# Patient Record
Sex: Female | Born: 2000 | Race: Black or African American | Hispanic: No | Marital: Single | Smoking: Current some day smoker
Health system: Southern US, Community
[De-identification: ages and names within clinical notes are randomized; demographics above are authoritative.]

## PROBLEM LIST (undated history)

## (undated) DIAGNOSIS — F329 Major depressive disorder, single episode, unspecified: Secondary | ICD-10-CM

## (undated) DIAGNOSIS — F32A Depression, unspecified: Secondary | ICD-10-CM

## (undated) DIAGNOSIS — T7840XA Allergy, unspecified, initial encounter: Secondary | ICD-10-CM

## (undated) HISTORY — DX: Major depressive disorder, single episode, unspecified: F32.9

## (undated) HISTORY — DX: Depression, unspecified: F32.A

## (undated) HISTORY — PX: MANDIBLE SURGERY: SHX707

---
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
## 2019-01-18 ENCOUNTER — Ambulatory Visit (HOSPITAL_COMMUNITY)
Admission: EM | Admit: 2019-01-18 | Discharge: 2019-01-18 | Disposition: A | Discharge status: Home / Self Care | Payer: BC Managed Care – PPO | Attending: Family Medicine | Admitting: Family Medicine

## 2019-01-18 ENCOUNTER — Other Ambulatory Visit: Payer: Self-pay

## 2019-01-18 ENCOUNTER — Encounter (HOSPITAL_COMMUNITY): Payer: Self-pay

## 2019-01-18 DIAGNOSIS — Z818 Family history of other mental and behavioral disorders: Secondary | ICD-10-CM | POA: Insufficient documentation

## 2019-01-18 DIAGNOSIS — R05 Cough: Secondary | ICD-10-CM | POA: Insufficient documentation

## 2019-01-18 DIAGNOSIS — Z9109 Other allergy status, other than to drugs and biological substances: Secondary | ICD-10-CM | POA: Diagnosis not present

## 2019-01-18 DIAGNOSIS — Z20828 Contact with and (suspected) exposure to other viral communicable diseases: Secondary | ICD-10-CM | POA: Insufficient documentation

## 2019-01-18 DIAGNOSIS — Z91013 Allergy to seafood: Secondary | ICD-10-CM | POA: Insufficient documentation

## 2019-01-18 DIAGNOSIS — Z915 Personal history of self-harm: Secondary | ICD-10-CM | POA: Diagnosis not present

## 2019-01-18 DIAGNOSIS — Z91018 Allergy to other foods: Secondary | ICD-10-CM | POA: Diagnosis not present

## 2019-01-18 DIAGNOSIS — J029 Acute pharyngitis, unspecified: Secondary | ICD-10-CM | POA: Insufficient documentation

## 2019-01-18 DIAGNOSIS — R059 Cough, unspecified: Secondary | ICD-10-CM

## 2019-01-18 LAB — POCT RAPID STREP A: Streptococcus, Group A Screen (Direct): NEGATIVE

## 2019-01-18 NOTE — Discharge Instructions (Signed)
Your COVID test is pending.  You should self quarantine until your test result is back and is negative.   ° °Go to the emergency department if you develop high fever, shortness of breath, severe diarrhea, or other concerning symptoms.   ° °

## 2019-01-18 NOTE — ED Provider Notes (Signed)
[place]    CSN: [id number] Arrival date & time: 01/18/19  1237      History   Chief Complaint Chief Complaint  Patient presents with   Sore Throat   Cough    HPI Yesenia Hatfield is a 18 y.o. female.   Patient presents with 4-day history of nonproductive cough.  She denies fever, chills, sore throat, shortness of breath, abdominal pain, vomiting, diarrhea, or other symptoms.  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  She requested COVID test today  The history is provided by the patient.    Past Medical History:  Diagnosis Date   Allergy    Depression    couple years    Patient Active Problem List   Diagnosis Date Noted   MDD (major depressive disorder), recurrent episode ([place]) 05/03/2015   Suicide attempt by multiple drug overdose ([place]) 05/03/2015    History reviewed. No pertinent surgical history.  OB History   No obstetric history on file.      Home Medications    Prior to Admission medications   Medication Sig Start Date End Date Taking? Authorizing Provider  ibuprofen (ADVIL,MOTRIN) 600 MG tablet Take 1 tab PO Q6H x 1-2 days then Q6H prn pain 04/26/16   Kristen Cardinal, NP    Family History Family History  Problem Relation Age of Onset   Depression Mother    Bipolar disorder Brother    Schizophrenia Brother    Drug abuse Maternal Grandfather    Drug abuse Maternal Grandmother    Depression Maternal Grandmother    Anxiety disorder Maternal Grandmother    Drug abuse Paternal Grandfather    Drug abuse Paternal Grandmother    Depression Paternal Grandmother    Suicidality Paternal Grandmother     Social History Social History   Tobacco Use   Smoking status: Never Smoker   Smokeless tobacco: Never Used  Substance Use Topics   Alcohol use: No   Drug use: Yes    Frequency: 3.0 times per week    Types: Marijuana    Comment: 1 time a week     Allergies     Citrus, Other, and Shellfish allergy   Review of Systems Review of Systems  Constitutional: Negative for chills and fever.  HENT: Negative for congestion, ear pain, rhinorrhea and sore throat.   Eyes: Negative for pain and visual disturbance.  Respiratory: Positive for cough. Negative for shortness of breath.   Cardiovascular: Negative for chest pain and palpitations.  Gastrointestinal: Negative for abdominal pain, diarrhea and vomiting.  Genitourinary: Negative for dysuria and hematuria.  Musculoskeletal: Negative for arthralgias and back pain.  Skin: Negative for color change and rash.  Neurological: Negative for seizures and syncope.  All other systems reviewed and are negative.    Physical Exam Triage Vital Signs ED Triage Vitals  Enc Vitals Group     BP      Pulse      Resp      Temp      Temp src      SpO2      Weight      Height      Head Circumference      Peak Flow      Pain Score      Pain Loc      Pain Edu?      Excl. in [place]?    No data found.  Updated Vital Signs  BP 128/73 (BP Location: Left Arm)    Pulse 93    Temp 98 F (36.7 C) (Tympanic)    Resp 17    LMP 12/14/2018    SpO2 100%   Visual Acuity Right Eye Distance:   Left Eye Distance:   Bilateral Distance:    Right Eye Near:   Left Eye Near:    Bilateral Near:     Physical Exam Vitals signs and nursing note reviewed.  Constitutional:      General: She is not in acute distress.    Appearance: She is well-developed.  HENT:     Head: Normocephalic and atraumatic.     Right Ear: Tympanic membrane normal.     Left Ear: Tympanic membrane normal.     Nose: Nose normal.     Mouth/Throat:     Mouth: Mucous membranes are moist.     Pharynx: Oropharynx is clear.  Eyes:     Conjunctiva/sclera: Conjunctivae normal.  Neck:     Musculoskeletal: Neck supple.  Cardiovascular:     Rate and Rhythm: Normal rate and regular rhythm.     Heart sounds: No murmur.  Pulmonary:     Effort: Pulmonary  effort is normal. No respiratory distress.     Breath sounds: Normal breath sounds.  Abdominal:     General: Bowel sounds are normal.     Palpations: Abdomen is soft.     Tenderness: There is no abdominal tenderness. There is no guarding or rebound.  Skin:    General: Skin is warm and dry.     Findings: No rash.  Neurological:     General: No focal deficit present.     Mental Status: She is alert and oriented to person, place, and time.      UC Treatments / Results  Labs (all labs ordered are listed, but only abnormal results are displayed) Labs Reviewed  NOVEL CORONAVIRUS, NAA (HOSP ORDER, SEND-OUT TO REF LAB; TAT 18-24 HRS)    EKG   Radiology No results found.  Procedures Procedures (including critical care time)  Medications Ordered in UC Medications - No data to display  Initial Impression / Assessment and Plan / UC Course  I have reviewed the triage vital signs and the nursing notes.  Pertinent labs & imaging results that were available during my care of the patient were reviewed by me and considered in my medical decision making (see chart for details).    Cough.  Instructed patient to take Tylenol as needed for discomfort or fever.  COVID test performed here.  Instructed patient to self quarantine until the test result is back.  Instructed patient to go to the emergency department if develops high fever, shortness of breath, severe diarrhea, or other concerning symptoms.  Patient agrees with plan of care.   Final Clinical Impressions(s) / UC Diagnoses   Final diagnoses:  Cough     Discharge Instructions     Your COVID test is pending.  You should self quarantine until your test result is back and is negative.    Go to the emergency department if you develop high fever, shortness of breath, severe diarrhea, or other concerning symptoms.       ED Prescriptions    None     PDMP not reviewed this encounter.   Mickie Bail, NP 01/18/19 1355

## 2019-01-18 NOTE — ED Triage Notes (Signed)
Pt presents with non productive cough and sore throat for past few days; pt states she had a covid exposure at work.

## 2019-01-19 LAB — NOVEL CORONAVIRUS, NAA (HOSP ORDER, SEND-OUT TO REF LAB; TAT 18-24 HRS): SARS-CoV-2, NAA: NOT DETECTED

## 2019-01-20 LAB — CULTURE, GROUP A STREP (THRC)

## 2019-01-24 ENCOUNTER — Telehealth (HOSPITAL_COMMUNITY): Payer: Self-pay | Admitting: Emergency Medicine

## 2019-01-24 NOTE — Telephone Encounter (Signed)
Pt called requesting test results. Results reported as negative.  

## 2019-06-13 ENCOUNTER — Other Ambulatory Visit: Payer: Self-pay

## 2019-06-13 ENCOUNTER — Encounter (HOSPITAL_COMMUNITY): Payer: Self-pay

## 2019-06-13 ENCOUNTER — Ambulatory Visit (HOSPITAL_COMMUNITY)
Admission: EM | Admit: 2019-06-13 | Discharge: 2019-06-13 | Disposition: A | Discharge status: Home / Self Care | Payer: BC Managed Care – PPO

## 2019-06-13 DIAGNOSIS — H66002 Acute suppurative otitis media without spontaneous rupture of ear drum, left ear: Secondary | ICD-10-CM | POA: Diagnosis not present

## 2019-06-13 MED ORDER — FLUTICASONE PROPIONATE 50 MCG/ACT NA SUSP: 1.0000 | Freq: Every day | NASAL | 0 refills | Status: DC

## 2019-06-13 MED ORDER — AMOXICILLIN-POT CLAVULANATE 875-125 MG PO TABS: 1.0000 | ORAL_TABLET | Freq: Two times a day (BID) | ORAL | 0 refills | Status: AC

## 2019-06-13 NOTE — Discharge Instructions (Signed)
Daily flonase.  Complete course of antibiotics.  Push fluids to ensure adequate hydration and keep secretions thin.  Continue with mucinex.  If symptoms worsen or do not improve in the next week to return to be seen or to follow up with PCP.

## 2019-06-13 NOTE — ED Triage Notes (Signed)
Pt is here with left ear pain that started 3 days ago, pt has taken tea tree oil & ear drops to relieve discomfort.

## 2019-06-13 NOTE — ED Provider Notes (Signed)
[place]    CSN: [id number] Arrival date & time: 06/13/19  0809      History   Chief Complaint Chief Complaint  Patient presents with  . Otalgia    HPI Yesenia Hatfield is a 19 y.o. female.   Yesenia Hatfield presents with complaints of left ear pain which started approximately 4 days ago. Started out with sinus pressure and drainage 1 week ago. Still with some nasal drainage. No cough. No headache. No fevers. No body aches. Now with left ear pain and decreased hearing. Pain 4/10. Has been using mucinex, over the counter ear drops and placing tea tree oil in the ear which hasn't helped. No drainage from the ear. Denies any previous similar.    ROS per HPI, negative if not otherwise mentioned.      Past Medical History:  Diagnosis Date  . Allergy   . Depression    couple years    Patient Active Problem List   Diagnosis Date Noted  . MDD (major depressive disorder), recurrent episode ([place]) 05/03/2015  . Suicide attempt by multiple drug overdose ([place]) 05/03/2015    History reviewed. No pertinent surgical history.  OB History   No obstetric history on file.      Home Medications    Prior to Admission medications   Medication Sig Start Date End Date Taking? Authorizing Brylei Pedley  amoxicillin-clavulanate (AUGMENTIN) 875-125 MG tablet Take 1 tablet by mouth every 12 (twelve) hours for 5 days. 06/13/19 06/18/19  Zigmund Gottron, NP  Biotin 1 MG CAPS Take by mouth.    Yoshi Vicencio, Historical, MD  Cholecalciferol 25 MCG (1000 UT) tablet Take by mouth.    Rodrickus Min, Historical, MD  cyclobenzaprine (FLEXERIL) 5 MG tablet Take by mouth.    Burrel Legrand, Historical, MD  fluticasone (FLONASE) 50 MCG/ACT nasal spray Place 1 spray into both nostrils daily. 06/13/19   Zigmund Gottron, NP  ibuprofen (ADVIL,MOTRIN) 600 MG tablet Take 1 tab PO Q6H x 1-2 days then Q6H prn pain 04/26/16   Kristen Cardinal, NP  Multiple Vitamin (MULTI-VITAMIN) tablet Take by mouth.    Xian Alves,  Historical, MD    Family History Family History  Problem Relation Age of Onset  . Depression Mother   . Hypertension Mother   . Bipolar disorder Brother   . Schizophrenia Brother   . Drug abuse Maternal Grandfather   . Drug abuse Maternal Grandmother   . Depression Maternal Grandmother   . Anxiety disorder Maternal Grandmother   . Drug abuse Paternal Grandfather   . Drug abuse Paternal Grandmother   . Depression Paternal Grandmother   . Suicidality Paternal Grandmother   . Diabetes Father     Social History Social History   Tobacco Use  . Smoking status: Never Smoker  . Smokeless tobacco: Never Used  Substance Use Topics  . Alcohol use: No  . Drug use: Yes    Frequency: 3.0 times per week    Types: Marijuana    Comment: 1 time a week     Allergies   Citrus, Other, and Shellfish allergy   Review of Systems Review of Systems   Physical Exam Triage Vital Signs ED Triage Vitals  Enc Vitals Group     BP 06/13/19 0825 121/83     Pulse Rate 06/13/19 0825 100     Resp 06/13/19 0825 19     Temp 06/13/19 0825 (!) 97.5 F (36.4 C)     Temp Source 06/13/19 0825 Oral     SpO2  06/13/19 0825 98 %     Weight 06/13/19 0823 295 lb 6.4 oz (134 kg)     Height --      Head Circumference --      Peak Flow --      Pain Score 06/13/19 0822 4     Pain Loc --      Pain Edu? --      Excl. in GC? --    No data found.  Updated Vital Signs BP 121/83 (BP Location: Right Arm)   Pulse 100   Temp (!) 97.5 F (36.4 C) (Oral)   Resp 19   Wt 295 lb 6.4 oz (134 kg)   LMP 05/17/2019   SpO2 98%   Visual Acuity Right Eye Distance:   Left Eye Distance:   Bilateral Distance:    Right Eye Near:   Left Eye Near:    Bilateral Near:     Physical Exam Constitutional:      General: She is not in acute distress.    Appearance: She is well-developed.  HENT:     Right Ear: Hearing and ear canal normal. A middle ear effusion is present.     Left Ear: A middle ear effusion is  present. Tympanic membrane is erythematous and bulging.  Cardiovascular:     Rate and Rhythm: Normal rate.  Pulmonary:     Effort: Pulmonary effort is normal.  Skin:    General: Skin is warm and dry.  Neurological:     Mental Status: She is alert and oriented to person, place, and time.      UC Treatments / Results  Labs (all labs ordered are listed, but only abnormal results are displayed) Labs Reviewed - No data to display  EKG   Radiology No results found.  Procedures Procedures (including critical care time)  Medications Ordered in UC Medications - No data to display  Initial Impression / Assessment and Plan / UC Course  I have reviewed the triage vital signs and the nursing notes.  Pertinent labs & imaging results that were available during my care of the patient were reviewed by me and considered in my medical decision making (see chart for details).     Left AOM. augmentin provided. Return precautions provided. Patient verbalized understanding and agreeable to plan.   Final Clinical Impressions(s) / UC Diagnoses   Final diagnoses:  Non-recurrent acute suppurative otitis media of left ear without spontaneous rupture of tympanic membrane     Discharge Instructions     Daily flonase.  Complete course of antibiotics.  Push fluids to ensure adequate hydration and keep secretions thin.  Continue with mucinex.  If symptoms worsen or do not improve in the next week to return to be seen or to follow up with PCP.      ED Prescriptions    Medication Sig Dispense Auth. Francille Wittmann   amoxicillin-clavulanate (AUGMENTIN) 875-125 MG tablet Take 1 tablet by mouth every 12 (twelve) hours for 5 days. 10 tablet Linus Mako B, NP   fluticasone (FLONASE) 50 MCG/ACT nasal spray Place 1 spray into both nostrils daily. 16 g Georgetta Haber, NP     PDMP not reviewed this encounter.   Georgetta Haber, NP 06/13/19 [phone]

## 2019-08-13 ENCOUNTER — Encounter (HOSPITAL_COMMUNITY): Payer: Self-pay | Admitting: Emergency Medicine

## 2019-08-13 ENCOUNTER — Emergency Department (HOSPITAL_COMMUNITY)
Admission: EM | Admit: 2019-08-13 | Discharge: 2019-08-13 | Disposition: A | Discharge status: Home / Self Care | Payer: BC Managed Care – PPO | Attending: Emergency Medicine | Admitting: Emergency Medicine

## 2019-08-13 DIAGNOSIS — K529 Noninfective gastroenteritis and colitis, unspecified: Secondary | ICD-10-CM | POA: Insufficient documentation

## 2019-08-13 DIAGNOSIS — R112 Nausea with vomiting, unspecified: Secondary | ICD-10-CM | POA: Insufficient documentation

## 2019-08-13 DIAGNOSIS — R197 Diarrhea, unspecified: Secondary | ICD-10-CM

## 2019-08-13 LAB — COMPREHENSIVE METABOLIC PANEL
ALT: 15 U/L (ref 0–44)
AST: 21 U/L (ref 15–41)
Albumin: 3.8 g/dL (ref 3.5–5.0)
Alkaline Phosphatase: 84 U/L (ref 38–126)
Anion gap: 12 (ref 5–15)
BUN: 9 mg/dL (ref 6–20)
CO2: 22 mmol/L (ref 22–32)
Calcium: 9.3 mg/dL (ref 8.9–10.3)
Chloride: 103 mmol/L (ref 98–111)
Creatinine, Ser: 0.91 mg/dL (ref 0.44–1.00)
GFR calc Af Amer: >60 mL/min (ref >60)
GFR calc non Af Amer: >60 mL/min (ref >60)
Glucose, Bld: 134 mg/dL — ABNORMAL HIGH (ref 70–99)
Potassium: 4 mmol/L (ref 3.5–5.1)
Sodium: 137 mmol/L (ref 135–145)
Total Bilirubin: 0.6 mg/dL (ref 0.3–1.2)
Total Protein: 8.5 g/dL — ABNORMAL HIGH (ref 6.5–8.1)

## 2019-08-13 LAB — RAPID URINE DRUG SCREEN, HOSP PERFORMED
Amphetamines: NOT DETECTED
Barbiturates: NOT DETECTED
Benzodiazepines: NOT DETECTED
Cocaine: NOT DETECTED
Opiates: NOT DETECTED
Tetrahydrocannabinol: POSITIVE — AB

## 2019-08-13 LAB — CBC
HCT: 43 % (ref 36.0–46.0)
Hemoglobin: 13.6 g/dL (ref 12.0–15.0)
MCH: 26.4 pg (ref 26.0–34.0)
MCHC: 31.6 g/dL (ref 30.0–36.0)
MCV: 83.5 fL (ref 80.0–100.0)
Platelets: 388 10*3/uL (ref 150–400)
RBC: 5.15 MIL/uL — ABNORMAL HIGH (ref 3.87–5.11)
RDW: 13.2 % (ref 11.5–15.5)
WBC: 16.3 10*3/uL — ABNORMAL HIGH (ref 4.0–10.5)
nRBC: 0 % (ref 0.0–0.2)

## 2019-08-13 LAB — URINALYSIS, ROUTINE W REFLEX MICROSCOPIC
Bilirubin Urine: NEGATIVE
Glucose, UA: NEGATIVE mg/dL
Hgb urine dipstick: NEGATIVE
Ketones, ur: NEGATIVE mg/dL
Leukocytes,Ua: NEGATIVE
Nitrite: NEGATIVE
Protein, ur: NEGATIVE mg/dL
Specific Gravity, Urine: 1.023 (ref 1.005–1.030)
pH: 5 (ref 5.0–8.0)

## 2019-08-13 LAB — LIPASE, BLOOD: Lipase: 24 U/L (ref 11–51)

## 2019-08-13 LAB — I-STAT BETA HCG BLOOD, ED (MC, WL, AP ONLY): I-stat hCG, quantitative: <5 m[IU]/mL (ref <5)

## 2019-08-13 MED ORDER — ONDANSETRON HCL 4 MG/2ML IJ SOLN
4.0000 mg | Freq: Once | INTRAMUSCULAR | Status: AC
  Administered 2019-08-13: 4 mg via INTRAVENOUS
  Filled 2019-08-13: qty 2

## 2019-08-13 MED ORDER — CAPSAICIN 0.025 % EX CREA
TOPICAL_CREAM | Freq: Once | CUTANEOUS | Status: AC
  Filled 2019-08-13: qty 60

## 2019-08-13 MED ORDER — SODIUM CHLORIDE 0.9 % IV BOLUS
1000.0000 mL | Freq: Once | INTRAVENOUS | Status: AC
  Administered 2019-08-13: 1000 mL via INTRAVENOUS

## 2019-08-13 MED ORDER — ONDANSETRON 4 MG PO TBDP: 4.0000 mg | ORAL_TABLET | Freq: Three times a day (TID) | ORAL | 0 refills | Status: AC | PRN

## 2019-08-13 MED ORDER — SODIUM CHLORIDE 0.9% FLUSH: 3.0000 mL | Freq: Once | INTRAVENOUS | Status: DC

## 2019-08-13 NOTE — ED Triage Notes (Signed)
Pt reports n/v/d since last night, concerned for possible food poisoning, denies fevers. Took pepto but unable to keep it down.

## 2019-08-13 NOTE — ED Notes (Signed)
Ambulated to the bathroom without incident 

## 2019-08-13 NOTE — ED Notes (Signed)
Tolerated PO fluids.

## 2019-08-13 NOTE — ED Provider Notes (Signed)
[facility] EMERGENCY DEPARTMENT Provider Note   CSN: [id number] Arrival date & time: 08/13/19  1222     History Chief Complaint  Patient presents with  . Emesis  . Diarrhea    Yesenia Hatfield is a 19 y.o. female with medical history significant for depression who presents for evaluation of emesis.  Patient states over the last 24 hours she has had nausea, vomiting and diarrhea.  No associated abdominal pain.  Patient reports multiple episodes of NBNB emesis.  No melena or bright red blood per rectum in her diarrhea.  Last episode diarrhea this morning.  No recent antibiotic use or travel.  Tried Pepto-Bismol however was unable to keep it down.  Did eat a seafood boil prior to symptoms starting however other people who ate the food have not been sick.  No fever, chills, chest pain, shortness of breath abdominal pain, pelvic pain, vaginal discharge, concerns for STDs.  Does admit to daily marijuana use.  Has not used today.  No prior history of cannabinoid hyperemesis.  Denies additional aggravating or relieving factors.  History obtained from patient and past medical records.  No interpreter is used.  HPI     Past Medical History:  Diagnosis Date  . Allergy   . Depression    couple years    Patient Active Problem List   Diagnosis Date Noted  . MDD (major depressive disorder), recurrent episode ([facility]) 05/03/2015  . Suicide attempt by multiple drug overdose ([facility]) 05/03/2015    History reviewed. No pertinent surgical history.   OB History   No obstetric history on file.     Family History  Problem Relation Age of Onset  . Depression Mother   . Hypertension Mother   . Bipolar disorder Brother   . Schizophrenia Brother   . Drug abuse Maternal Grandfather   . Drug abuse Maternal Grandmother   . Depression Maternal Grandmother   . Anxiety disorder Maternal Grandmother   . Drug abuse Paternal Grandfather   . Drug abuse Paternal Grandmother   .  Depression Paternal Grandmother   . Suicidality Paternal Grandmother   . Diabetes Father     Social History   Tobacco Use  . Smoking status: Never Smoker  . Smokeless tobacco: Never Used  Substance Use Topics  . Alcohol use: No  . Drug use: Yes    Frequency: 3.0 times per week    Types: Marijuana    Comment: 1 time a week    Home Medications Prior to Admission medications   Medication Sig Start Date End Date Taking? Authorizing Provider  fluticasone (FLONASE) 50 MCG/ACT nasal spray Place 1 spray into both nostrils daily. Patient not taking: Reported on 08/13/2019 06/13/19   Georgetta Haber, NP  ibuprofen (ADVIL,MOTRIN) 600 MG tablet Take 1 tab PO Q6H x 1-2 days then Q6H prn pain Patient not taking: Reported on 08/13/2019 04/26/16   Lowanda Foster, NP    Allergies    Citrus, Other, and Shellfish allergy  Review of Systems   Review of Systems  Constitutional: Negative.   HENT: Negative.   Respiratory: Negative.   Cardiovascular: Negative.   Gastrointestinal: Positive for diarrhea, nausea and vomiting. Negative for abdominal distention, abdominal pain, anal bleeding, blood in stool, constipation and rectal pain.  Genitourinary: Negative.   Musculoskeletal: Negative.   Skin: Negative.   Neurological: Negative.   All other systems reviewed and are negative.   Physical Exam Updated Vital Signs BP (!) 151/103 (BP Location: Right Arm)  Pulse (!) 122   Temp 98.3 F (36.8 C) (Oral)   Resp 14   Ht 5\' 7"  (1.702 m)   Wt 136.1 kg   SpO2 98%   BMI 46.99 kg/m   Physical Exam Vitals and nursing note reviewed.  Constitutional:      General: She is not in acute distress.    Appearance: She is well-developed. She is not ill-appearing, toxic-appearing or diaphoretic.  HENT:     Head: Normocephalic and atraumatic.     Nose: Nose normal.     Mouth/Throat:     Mouth: Mucous membranes are moist.     Pharynx: Oropharynx is clear.  Eyes:     Pupils: Pupils are equal, round, and  reactive to light.  Cardiovascular:     Rate and Rhythm: Tachycardia present.     Pulses: Normal pulses.     Heart sounds: Normal heart sounds.  Pulmonary:     Effort: Pulmonary effort is normal. No respiratory distress.     Breath sounds: Normal breath sounds.  Abdominal:     General: Bowel sounds are normal. There is no distension.     Palpations: There is no mass.     Tenderness: There is no abdominal tenderness. There is no right CVA tenderness, left CVA tenderness, guarding or rebound.     Comments: Soft, nontender without rebound or guarding.  Negative Murphy sign, Burney point.  Negative CVA tap bilaterally.  No overlying skin changes to abdominal wall.  Musculoskeletal:        General: Normal range of motion.     Cervical back: Normal range of motion.     Comments: Moves all 4 extremities without difficulty.  Skin:    General: Skin is warm and dry.     Capillary Refill: Capillary refill takes less than 2 seconds.  Neurological:     General: No focal deficit present.     Mental Status: She is alert and oriented to person, place, and time.     Comments: Ambulatory without ataxic gait.     ED Results / Procedures / Treatments   Labs (all labs ordered are listed, but only abnormal results are displayed) Labs Reviewed  COMPREHENSIVE METABOLIC PANEL - Abnormal; Notable for the following components:      Result Value   Glucose, Bld 134 (*)    Total Protein 8.5 (*)    All other components within normal limits  CBC - Abnormal; Notable for the following components:   WBC 16.3 (*)    RBC 5.15 (*)    All other components within normal limits  URINALYSIS, ROUTINE W REFLEX MICROSCOPIC - Abnormal; Notable for the following components:   APPearance HAZY (*)    All other components within normal limits  RAPID URINE DRUG SCREEN, HOSP PERFORMED - Abnormal; Notable for the following components:   Tetrahydrocannabinol POSITIVE (*)    All other components within normal limits  LIPASE,  BLOOD  I-STAT BETA HCG BLOOD, ED ([facility], [facility], [facility] ONLY)    EKG None  Radiology No results found.  Procedures Procedures (including critical care time)  Medications Ordered in ED Medications  sodium chloride flush (NS) 0.9 % injection 3 mL ( Intravenous Canceled Entry 08/13/19 1550)  capsaicin (ZOSTRIX) 0.025 % cream (has no administration in time range)  sodium chloride 0.9 % bolus 1,000 mL (1,000 mLs Intravenous New Bag/Given 08/13/19 1548)  ondansetron (ZOFRAN) injection 4 mg (4 mg Intravenous Given 08/13/19 1550)   ED Course  I have reviewed the  triage vital signs and the nursing notes.  Pertinent labs & imaging results that were available during my care of the patient were reviewed by me and considered in my medical decision making (see chart for details).  19 year old female presents for evaluation of nausea, vomiting and diarrhea.  Afebrile, nonseptic, non-ill-appearing. Symptoms over the last 24 hours.  No suspicious food intake or additional sick contacts.  No recent travel or antibiotic use.  Abdomen soft, nontender.  No associated abdominal pain.  Emesis NBNB, no melena or bright red blood per rectum.  She is a daily marijuana user.  She is tachycardic however denies chest pain, shortness of breath.  No risk factors for PE.  Bevelyn Buckles' sign negative.  No evidence of DVT on exam.  Labs obtained from triage personally reviewed interpreted:  CBC with leukocytosis at 24.8 Metabolic panel with mild hyperglycemia 134 however no additional electrolyte, renal abnormality Pregnancy test negative Lipase 24 UA negative for infection UDS positive for THC EKG tachycardia no STEMI. QTC 443  Care transferred to [facility], PA-C who will follow up on reassessment and determine disposition.    MDM Rules/Calculators/A&P                       Final Clinical Impression(s) / ED Diagnoses Final diagnoses:  Nausea vomiting and diarrhea    Rx / DC Orders ED Discharge Orders    None        Henderly, Britni A, PA-C 08/13/19 1611    Little, Wenda Overland, MD 08/14/19 [phone]

## 2019-08-13 NOTE — ED Notes (Signed)
Patient verbalizes understanding of discharge instructions. Opportunity for questioning and answers were provided. Armband removed by staff, pt discharged from ED.  

## 2019-08-13 NOTE — Discharge Instructions (Addendum)
Take Zofran for nausea. Push fluids to avoid dehydration. Recommend Imodium if you are having greater than 5 bowel movements daily.   Return to the emergency department with any worsening symptoms or new concerns.

## 2019-08-13 NOTE — ED Provider Notes (Signed)
24 hours N, V, D No fever, abdomen benign Getting fluids now,    Pending additional fluids, PO challenge  4:45 - Recheck - the patient is feeling much better and is asymptomatic now. PO challenge given with ginger ale.   Labs reviewed. She has a leukocytosis of 16.3. No fever, no abdominal tenderness, symptoms improved. Feel the elevated white count is likely reactive. Otherwise labs unremarkable.   HR still 118 but improved. Will give second liter and anticipate discharge home after that.   6:30 - HR 109 on monitor. She has documented history of mild tachycardia in the past. She walked to the bathroom without any symptoms. She feels significantly better. Feel she can be discharged home with Zofran, care instructions, return precautions.    Elpidio Anis, PA-C 08/13/19 1843    Melene Plan, DO 08/13/19 2216

## 2019-08-14 ENCOUNTER — Emergency Department (HOSPITAL_COMMUNITY)
Admission: EM | Admit: 2019-08-14 | Discharge: 2019-08-14 | Disposition: A | Discharge status: Home / Self Care | Payer: 59 | Attending: Emergency Medicine | Admitting: Emergency Medicine

## 2019-08-14 ENCOUNTER — Emergency Department (HOSPITAL_COMMUNITY): Payer: 59

## 2019-08-14 ENCOUNTER — Encounter (HOSPITAL_COMMUNITY): Payer: Self-pay | Admitting: *Deleted

## 2019-08-14 ENCOUNTER — Other Ambulatory Visit: Payer: Self-pay

## 2019-08-14 DIAGNOSIS — Z20822 Contact with and (suspected) exposure to covid-19: Secondary | ICD-10-CM | POA: Insufficient documentation

## 2019-08-14 DIAGNOSIS — R000 Tachycardia, unspecified: Secondary | ICD-10-CM | POA: Insufficient documentation

## 2019-08-14 DIAGNOSIS — R0789 Other chest pain: Secondary | ICD-10-CM | POA: Diagnosis not present

## 2019-08-14 DIAGNOSIS — F419 Anxiety disorder, unspecified: Secondary | ICD-10-CM | POA: Diagnosis not present

## 2019-08-14 DIAGNOSIS — R0602 Shortness of breath: Secondary | ICD-10-CM | POA: Diagnosis not present

## 2019-08-14 DIAGNOSIS — R197 Diarrhea, unspecified: Secondary | ICD-10-CM | POA: Insufficient documentation

## 2019-08-14 DIAGNOSIS — R5383 Other fatigue: Secondary | ICD-10-CM | POA: Diagnosis not present

## 2019-08-14 LAB — TROPONIN I (HIGH SENSITIVITY)
Troponin I (High Sensitivity): 4 ng/L (ref <18)
Troponin I (High Sensitivity): 6 ng/L (ref <18)

## 2019-08-14 LAB — COMPREHENSIVE METABOLIC PANEL
ALT: 14 U/L (ref 0–44)
AST: 17 U/L (ref 15–41)
Albumin: 3.3 g/dL — ABNORMAL LOW (ref 3.5–5.0)
Alkaline Phosphatase: 71 U/L (ref 38–126)
Anion gap: 10 (ref 5–15)
BUN: 7 mg/dL (ref 6–20)
CO2: 22 mmol/L (ref 22–32)
Calcium: 8.4 mg/dL — ABNORMAL LOW (ref 8.9–10.3)
Chloride: 103 mmol/L (ref 98–111)
Creatinine, Ser: 0.9 mg/dL (ref 0.44–1.00)
GFR calc Af Amer: >60 mL/min (ref >60)
GFR calc non Af Amer: >60 mL/min (ref >60)
Glucose, Bld: 107 mg/dL — ABNORMAL HIGH (ref 70–99)
Potassium: 3.5 mmol/L (ref 3.5–5.1)
Sodium: 135 mmol/L (ref 135–145)
Total Bilirubin: 0.4 mg/dL (ref 0.3–1.2)
Total Protein: 7 g/dL (ref 6.5–8.1)

## 2019-08-14 LAB — CBC
HCT: 37.7 % (ref 36.0–46.0)
Hemoglobin: 12 g/dL (ref 12.0–15.0)
MCH: 26.9 pg (ref 26.0–34.0)
MCHC: 31.8 g/dL (ref 30.0–36.0)
MCV: 84.5 fL (ref 80.0–100.0)
Platelets: 304 10*3/uL (ref 150–400)
RBC: 4.46 MIL/uL (ref 3.87–5.11)
RDW: 13.2 % (ref 11.5–15.5)
WBC: 11.6 10*3/uL — ABNORMAL HIGH (ref 4.0–10.5)
nRBC: 0 % (ref 0.0–0.2)

## 2019-08-14 LAB — D-DIMER, QUANTITATIVE: D-Dimer, Quant: 0.33 ug/mL-FEU (ref 0.00–0.50)

## 2019-08-14 LAB — SARS CORONAVIRUS 2 BY RT PCR (HOSPITAL ORDER, PERFORMED IN ~~LOC~~ HOSPITAL LAB): SARS Coronavirus 2: NEGATIVE

## 2019-08-14 LAB — LIPASE, BLOOD: Lipase: 22 U/L (ref 11–51)

## 2019-08-14 MED ORDER — SODIUM CHLORIDE 0.9 % IV BOLUS
1000.0000 mL | Freq: Once | INTRAVENOUS | Status: AC
  Administered 2019-08-14: 1000 mL via INTRAVENOUS

## 2019-08-14 NOTE — Discharge Instructions (Signed)
Your work-up today was overall reassuring.  Your coronavirus test was negative.  Your x-ray was reassuring.  I do not think you have a heart or lung cause of your symptoms your blood clot test was negative.  I suspect your symptoms are related to all the nausea, vomiting, diarrhea yesterday and some of the dehydration and fluid balance shifts you had.  As you were feeling better and your work-up look so good, we feel you are safe for discharge home now.  Please rest and stay hydrated and follow-up with your primary doctor.  If any symptoms change or worsen, please return to the nearest emergency department.

## 2019-08-14 NOTE — ED Provider Notes (Signed)
[facility] EMERGENCY DEPARTMENT Provider Note   CSN: [id number] Arrival date & time: 08/14/19  0430     History Chief Complaint  Patient presents with  . Chest Pain    Lourine Raczynski is a 19 y.o. female.  The history is provided by the patient and medical records. No language interpreter was used.  Chest Pain Pain location:  Substernal area Pain quality: aching   Pain radiates to:  Does not radiate Pain severity:  Mild Onset quality:  Gradual Duration:  1 day Timing:  Intermittent Progression:  Improving Chronicity:  New Relieved by:  Nothing Exacerbated by: vomiting. Ineffective treatments:  None tried Associated symptoms: anxiety, nausea and vomiting (resoklved)   Associated symptoms: no abdominal pain, no back pain, no cough, no diaphoresis, no dizziness, no dysphagia, no fatigue, no fever, no headache, no heartburn, no lower extremity edema, no near-syncope, no palpitations, no shortness of breath and no weakness        Past Medical History:  Diagnosis Date  . Allergy   . Depression    couple years    Patient Active Problem List   Diagnosis Date Noted  . MDD (major depressive disorder), recurrent episode (HCC) 05/03/2015  . Suicide attempt by multiple drug overdose (HCC) 05/03/2015    History reviewed. No pertinent surgical history.   OB History   No obstetric history on file.     Family History  Problem Relation Age of Onset  . Depression Mother   . Hypertension Mother   . Bipolar disorder Brother   . Schizophrenia Brother   . Drug abuse Maternal Grandfather   . Drug abuse Maternal Grandmother   . Depression Maternal Grandmother   . Anxiety disorder Maternal Grandmother   . Drug abuse Paternal Grandfather   . Drug abuse Paternal Grandmother   . Depression Paternal Grandmother   . Suicidality Paternal Grandmother   . Diabetes Father     Social History   Tobacco Use  . Smoking status: Never Smoker  . Smokeless  tobacco: Never Used  Substance Use Topics  . Alcohol use: No  . Drug use: Yes    Frequency: 3.0 times per week    Types: Marijuana    Comment: 1 time a week    Home Medications Prior to Admission medications   Medication Sig Start Date End Date Taking? Authorizing Provider  fluticasone (FLONASE) 50 MCG/ACT nasal spray Place 1 spray into both nostrils daily. Patient not taking: Reported on 08/13/2019 06/13/19   Linus Mako B, NP  ibuprofen (ADVIL,MOTRIN) 600 MG tablet Take 1 tab PO Q6H x 1-2 days then Q6H prn pain Patient not taking: Reported on 08/13/2019 04/26/16   Lowanda Foster, NP  ondansetron (ZOFRAN ODT) 4 MG disintegrating tablet Take 1 tablet (4 mg total) by mouth every 8 (eight) hours as needed for nausea or vomiting. 08/13/19   Elpidio Anis, PA-C    Allergies    Citrus, Other, and Shellfish allergy  Review of Systems   Review of Systems  Constitutional: Negative for chills, diaphoresis, fatigue and fever.  HENT: Negative for congestion and trouble swallowing.   Eyes: Negative for visual disturbance.  Respiratory: Negative for cough, chest tightness, shortness of breath, wheezing and stridor.   Cardiovascular: Positive for chest pain. Negative for palpitations and near-syncope.  Gastrointestinal: Positive for diarrhea, nausea and vomiting (resoklved). Negative for abdominal distention, abdominal pain, constipation and heartburn.  Genitourinary: Negative for flank pain and frequency.  Musculoskeletal: Negative for back pain, neck pain and  neck stiffness.  Neurological: Negative for dizziness, weakness, light-headedness and headaches.  Psychiatric/Behavioral: Negative for agitation.  All other systems reviewed and are negative.   Physical Exam Updated Vital Signs BP 129/80 (BP Location: Right Arm)   Pulse (!) 110   Temp 98.7 F (37.1 C) (Oral)   Resp 20   Ht 5\' 7"  (1.702 m)   Wt 136.1 kg   LMP 07/24/2019   SpO2 99%   BMI 46.99 kg/m   Physical Exam Vitals and  nursing note reviewed.  Constitutional:      General: She is not in acute distress.    Appearance: She is well-developed. She is not ill-appearing, toxic-appearing or diaphoretic.  HENT:     Head: Normocephalic and atraumatic.  Eyes:     Conjunctiva/sclera: Conjunctivae normal.     Pupils: Pupils are equal, round, and reactive to light.  Cardiovascular:     Rate and Rhythm: Regular rhythm. Tachycardia present.     Heart sounds: Normal heart sounds. No murmur.  Pulmonary:     Effort: Pulmonary effort is normal. No respiratory distress.     Breath sounds: Normal breath sounds. No decreased breath sounds, wheezing, rhonchi or rales.  Abdominal:     Palpations: Abdomen is soft.     Tenderness: There is no abdominal tenderness.  Musculoskeletal:     Cervical back: Neck supple.     Right lower leg: No tenderness. No edema.     Left lower leg: No tenderness. No edema.  Skin:    General: Skin is warm and dry.     Capillary Refill: Capillary refill takes less than 2 seconds.     Findings: No erythema.  Neurological:     General: No focal deficit present.     Mental Status: She is alert.  Psychiatric:        Mood and Affect: Mood normal.     ED Results / Procedures / Treatments   Labs (all labs ordered are listed, but only abnormal results are displayed) Labs Reviewed  COMPREHENSIVE METABOLIC PANEL - Abnormal; Notable for the following components:      Result Value   Glucose, Bld 107 (*)    Calcium 8.4 (*)    Albumin 3.3 (*)    All other components within normal limits  CBC - Abnormal; Notable for the following components:   WBC 11.6 (*)    All other components within normal limits  SARS CORONAVIRUS 2 BY RT PCR (HOSPITAL ORDER, PERFORMED IN [place] [facility])  LIPASE, BLOOD  D-DIMER, QUANTITATIVE (NOT AT [facility])  TROPONIN I (HIGH SENSITIVITY)  TROPONIN I (HIGH SENSITIVITY)    EKG EKG Interpretation  Date/Time:  Sunday Aug 14 2019 04:39:56 EDT Ventricular Rate:    105 PR Interval:  134 QRS Duration: 88 QT Interval:  328 QTC Calculation: 433 R Axis:   30 Text Interpretation: Sinus tachycardia Otherwise normal ECG When compared to priorl similar tachycardia. No STEMI Confirmed by [id number] (Theda Belfast) on 08/14/2019 7:53:41 AM   Radiology DG Chest 2 View  Result Date: 08/14/2019 CLINICAL DATA:  Chest pain and SOB. EXAM: CHEST - 2 VIEW COMPARISON:  None FINDINGS: The heart size and mediastinal contours are within normal limits. Both lungs are clear. The visualized skeletal structures are unremarkable. IMPRESSION: No active cardiopulmonary disease. Electronically Signed   By: 08/16/2019 M.D.   On: 08/14/2019 09:11    Procedures Procedures (including critical care time)  Medications Ordered in ED Medications  sodium chloride 0.9 % bolus  1,000 mL (0 mLs Intravenous Stopped 08/14/19 1059)    ED Course  I have reviewed the triage vital signs and the nursing notes.  Pertinent labs & imaging results that were available during my care of the patient were reviewed by me and considered in my medical decision making (see chart for details).    MDM Rules/Calculators/A&P                      Tamyia Kallen is a 19 y.o. female with a past medical history significant for depression and recent visit yesterday to the emergency department for nausea, vomiting, diarrhea who presents with development of chest pain, shortness of breath, and persistent diarrhea.  Patient reports that yesterday, she came to the emergency department and had intense nausea, vomiting, diarrhea.  She reports that she was tachycardic at the time and was given some fluids with improvement.  She was feeling much better and has not had any emesis since leaving the emergency department but has had consistent diarrhea.  She says that overnight, she woke up at around 3 AM with chest pain, chest tightness, shortness of breath.  She reports that she tried to walk around was very winded.  She  reports feeling very fatigued and her chest discomfort was worsened with both exertion and deep breathing.  It is pleuritic.  It is a pressure and tightness pain in her central chest that does not radiate.  She reports no further nausea and vomiting.  She denies any urinary symptoms.  She denies any trauma.  She denied any hemoptysis or vomiting blood.  No bloody stools with the diarrhea.  On exam, lungs were clear and chest was nontender.  I cannot reproduce her discomfort.  Abdomen was nontender.  No wheezing.  No murmurs.  Good pulses in extremities.  Legs nontender nonedematous.  Patient is tachycardic with a rate around 115 on my evaluation.  EKG shows no STEMI, just sinus tachycardia.  Clinically I suspect patient symptoms may be related to all the nausea vomiting, diarrhea she had yesterday potentially causing some muscle soreness in her torso as well as dehydration.  We will give her some fluids to see if this helps her heart rate.  However, given the tachycardia and development of both exertional and pleuritic chest pain, we will get a D-dimer to rule out PE.  We will also get a chest x-ray and further screening labs.  Had a shared decision made conversation with patient and mother and we agree to treat her with fluids and monitor and rule out other concerning etiology of her symptoms.  Work-up otherwise returned reassuring.  Troponin negative x2.  Covid test negative.  X-ray shows no pneumonia.  Suspect symptoms are related to the nausea and vomit she had yesterday causing muscle aches and pains.  Given her monitoring for several hours with resolution of symptoms and well appearance and now tolerating food and fluids without difficulty, I feel she is safe for discharge home.  Patient and family agree with plan of care for discharge and was given a work note.  Patient has no other questions or concerns and understands follow-up instructions and return precautions.  Patient discharged in good  condition after reassuring work-up.   Final Clinical Impression(s) / ED Diagnoses Final diagnoses:  Atypical chest pain  Diarrhea, unspecified type  Fatigue, unspecified type    Rx / DC Orders ED Discharge Orders    None      Clinical Impression: 1. Atypical  chest pain   2. Diarrhea, unspecified type   3. Fatigue, unspecified type     Disposition: Discharge  Condition: Good  I have discussed the results, Dx and Tx plan with the pt(& family if present). He/she/they expressed understanding and agree(s) with the plan. Discharge instructions discussed at great length. Strict return precautions discussed and pt &/or family have verbalized understanding of the instructions. No further questions at time of discharge.    Discharge Medication List as of 08/14/2019  3:58 PM      Follow Up: [place] [address] [place] [address] [phone] Schedule an appointment as soon as possible for a visit    [facility] [address] [place]       Treson Laura, Gwenyth Allegra, MD 08/14/19 [phone]

## 2019-08-14 NOTE — ED Triage Notes (Signed)
The pt was here earlier tonight with vomiting she reports that she vomited so much that her chest is hurting now  No distress

## 2019-11-09 ENCOUNTER — Ambulatory Visit (INDEPENDENT_AMBULATORY_CARE_PROVIDER_SITE_OTHER): Payer: 59

## 2019-11-09 ENCOUNTER — Ambulatory Visit (HOSPITAL_COMMUNITY)
Admission: EM | Admit: 2019-11-09 | Discharge: 2019-11-09 | Disposition: A | Discharge status: Home / Self Care | Payer: 59 | Attending: Physician Assistant | Admitting: Physician Assistant

## 2019-11-09 ENCOUNTER — Other Ambulatory Visit: Payer: Self-pay

## 2019-11-09 DIAGNOSIS — M25512 Pain in left shoulder: Secondary | ICD-10-CM

## 2019-11-09 MED ORDER — DICLOFENAC SODIUM 75 MG PO TBEC: 75.0000 mg | DELAYED_RELEASE_TABLET | Freq: Two times a day (BID) | ORAL | 0 refills | Status: AC

## 2019-11-09 MED ORDER — METHOCARBAMOL 500 MG PO TABS: 500.0000 mg | ORAL_TABLET | Freq: Four times a day (QID) | ORAL | 0 refills | Status: AC

## 2019-11-09 NOTE — ED Provider Notes (Signed)
[facility]    CSN: [id number] Arrival date & time: 11/09/19  1126      History   Chief Complaint Chief Complaint  Patient presents with  . Shoulder Pain    HPI Yesenia Hatfield is a 19 y.o. female.   The history is provided by the patient. No language interpreter was used.  Shoulder Pain Location:  Shoulder Shoulder location:  R shoulder Injury: no   Pain details:    Quality:  Aching   Radiates to:  L shoulder   Severity:  Moderate   Onset quality:  Gradual   Timing:  Constant   Progression:  Worsening Foreign body present:  No foreign bodies Relieved by:  Nothing Worsened by:  Nothing Associated symptoms: swelling   Pt complains of pain in her shoulder after waking up.    Past Medical History:  Diagnosis Date  . Allergy   . Depression    couple years    Patient Active Problem List   Diagnosis Date Noted  . MDD (major depressive disorder), recurrent episode (HCC) 05/03/2015  . Suicide attempt by multiple drug overdose (HCC) 05/03/2015    No past surgical history on file.  OB History   No obstetric history on file.      Home Medications    Prior to Admission medications   Medication Sig Start Date End Date Taking? Authorizing Provider  fluticasone (FLONASE) 50 MCG/ACT nasal spray Place 1 spray into both nostrils daily. Patient not taking: Reported on 08/13/2019 06/13/19   Linus Mako B, NP  ibuprofen (ADVIL,MOTRIN) 600 MG tablet Take 1 tab PO Q6H x 1-2 days then Q6H prn pain Patient not taking: Reported on 08/13/2019 04/26/16   Lowanda Foster, NP  ondansetron (ZOFRAN ODT) 4 MG disintegrating tablet Take 1 tablet (4 mg total) by mouth every 8 (eight) hours as needed for nausea or vomiting. 08/13/19   Elpidio Anis, PA-C    Family History Family History  Problem Relation Age of Onset  . Depression Mother   . Hypertension Mother   . Bipolar disorder Brother   . Schizophrenia Brother   . Drug abuse Maternal Grandfather   . Drug abuse  Maternal Grandmother   . Depression Maternal Grandmother   . Anxiety disorder Maternal Grandmother   . Drug abuse Paternal Grandfather   . Drug abuse Paternal Grandmother   . Depression Paternal Grandmother   . Suicidality Paternal Grandmother   . Diabetes Father     Social History Social History   Tobacco Use  . Smoking status: Never Smoker  . Smokeless tobacco: Never Used  Substance Use Topics  . Alcohol use: No  . Drug use: Yes    Frequency: 3.0 times per week    Types: Marijuana    Comment: 1 time a week     Allergies   Citrus, Other, and Shellfish allergy   Review of Systems Review of Systems  All other systems reviewed and are negative.    Physical Exam Triage Vital Signs ED Triage Vitals  Enc Vitals Group     BP 11/09/19 1238 122/75     Pulse Rate 11/09/19 1238 80     Resp 11/09/19 1238 17     Temp 11/09/19 1238 98.5 F (36.9 C)     Temp Source 11/09/19 1238 Oral     SpO2 11/09/19 1238 100 %     Weight --      Height --      Head Circumference --  Peak Flow --      Pain Score 11/09/19 1236 10     Pain Loc --      Pain Edu? --      Excl. in GC? --    No data found.  Updated Vital Signs BP 122/75 (BP Location: Right Arm)   Pulse 80   Temp 98.5 F (36.9 C) (Oral)   Resp 17   LMP  (Within Weeks) Comment: 1 WEEK  SpO2 100%   Visual Acuity Right Eye Distance:   Left Eye Distance:   Bilateral Distance:    Right Eye Near:   Left Eye Near:    Bilateral Near:     Physical Exam Vitals and nursing note reviewed.  Constitutional:      Appearance: She is well-developed.  HENT:     Head: Normocephalic.  Cardiovascular:     Rate and Rhythm: Normal rate.  Pulmonary:     Effort: Pulmonary effort is normal.  Abdominal:     General: There is no distension.  Musculoskeletal:        General: Tenderness present. No swelling, deformity or signs of injury. Normal range of motion.     Cervical back: Normal range of motion.  Skin:    General:  Skin is warm.  Neurological:     General: No focal deficit present.     Mental Status: She is alert and oriented to person, place, and time.  Psychiatric:        Mood and Affect: Mood normal.      UC Treatments / Results  Labs (all labs ordered are listed, but only abnormal results are displayed) Labs Reviewed - No data to display  EKG   Radiology DG Shoulder Left  Result Date: 11/09/2019 CLINICAL DATA:  Left shoulder pain for the past 2 days.  No injury. EXAM: LEFT SHOULDER - 2+ VIEW COMPARISON:  None. FINDINGS: There is no evidence of fracture or dislocation. There is no evidence of arthropathy or other focal bone abnormality. Soft tissues are unremarkable. IMPRESSION: Negative. Electronically Signed   By: Obie Dredge M.D.   On: 11/09/2019 14:30    Procedures Procedures (including critical care time)  Medications Ordered in UC Medications - No data to display  Initial Impression / Assessment and Plan / UC Course  I have reviewed the triage vital signs and the nursing notes.  Pertinent labs & imaging results that were available during my care of the patient were reviewed by me and considered in my medical decision making (see chart for details).     MDM: xray normal.  I suspect pain is muscular.  I will treat pt with voltaren and robaxin  Final Clinical Impressions(s) / UC Diagnoses   Final diagnoses:  Acute pain of left shoulder   Discharge Instructions   None    ED Prescriptions    None     PDMP not reviewed this encounter.  An After Visit Summary was printed and given to the patient.    Elson Areas, [place] 11/09/19 1508

## 2019-11-09 NOTE — ED Triage Notes (Signed)
Pt presents with left shoulder pain x 1 day. States she woke up yesterday with left shoulder pain. Denies trauma, fall.

## 2019-11-09 NOTE — Discharge Instructions (Signed)
Return if any problems.

## 2020-04-10 ENCOUNTER — Ambulatory Visit (HOSPITAL_COMMUNITY)
Admission: EM | Admit: 2020-04-10 | Discharge: 2020-04-10 | Disposition: A | Discharge status: Home / Self Care | Payer: 59

## 2020-04-10 ENCOUNTER — Other Ambulatory Visit: Payer: Self-pay

## 2020-11-03 ENCOUNTER — Encounter (HOSPITAL_COMMUNITY): Payer: Self-pay

## 2020-11-03 ENCOUNTER — Other Ambulatory Visit: Payer: Self-pay

## 2020-11-03 ENCOUNTER — Ambulatory Visit (HOSPITAL_COMMUNITY)
Admission: EM | Admit: 2020-11-03 | Discharge: 2020-11-03 | Disposition: A | Discharge status: Home / Self Care | Payer: 59 | Attending: Medical Oncology | Admitting: Medical Oncology

## 2020-11-03 DIAGNOSIS — S61217A Laceration without foreign body of left little finger without damage to nail, initial encounter: Secondary | ICD-10-CM | POA: Diagnosis not present

## 2020-11-03 DIAGNOSIS — Z23 Encounter for immunization: Secondary | ICD-10-CM | POA: Diagnosis not present

## 2020-11-03 MED ORDER — LIDOCAINE HCL (PF) 1 % IJ SOLN
INTRAMUSCULAR | Status: AC
  Filled 2020-11-03: qty 30

## 2020-11-03 MED ORDER — LIDOCAINE HCL (PF) 2 % IJ SOLN
INTRAMUSCULAR | Status: AC
  Filled 2020-11-03: qty 10

## 2020-11-03 MED ORDER — TETANUS-DIPHTH-ACELL PERTUSSIS 5-2.5-18.5 LF-MCG/0.5 IM SUSY
0.5000 mL | PREFILLED_SYRINGE | Freq: Once | INTRAMUSCULAR | Status: AC
  Administered 2020-11-03: 0.5 mL via INTRAMUSCULAR

## 2020-11-03 MED ORDER — BACITRACIN ZINC 500 UNIT/GM EX OINT
TOPICAL_OINTMENT | CUTANEOUS | Status: AC
  Filled 2020-11-03: qty 0.9

## 2020-11-03 MED ORDER — DOXYCYCLINE HYCLATE 100 MG PO CAPS: 100.0000 mg | ORAL_CAPSULE | Freq: Two times a day (BID) | ORAL | 0 refills | Status: AC

## 2020-11-03 MED ORDER — TETANUS-DIPHTH-ACELL PERTUSSIS 5-2.5-18.5 LF-MCG/0.5 IM SUSY
PREFILLED_SYRINGE | INTRAMUSCULAR | Status: AC
  Filled 2020-11-03: qty 0.5

## 2020-11-03 NOTE — Discharge Instructions (Addendum)
Please return in 7 days for suture removal

## 2020-11-03 NOTE — ED Provider Notes (Signed)
[facility]    CSN: [id number] Arrival date & time: 11/03/20  1007      History   Chief Complaint Chief Complaint  Patient presents with   Laceration    HPI Yesenia Hatfield is a 20 y.o. female.   HPI  Laceration: Patient reports that she was using a meat slicer while working at [employer] and accidentally caught her left pinky finger in the meat slicer.  This occurred about 2 hours ago.  She has had mild bleeding from the area since.  She reports that she is able to feel her finger and is able to complete range of motion of the finger.  She reports that she is not up-to-date with her Tdap.   Past Medical History:  Diagnosis Date   Allergy    Depression    couple years    Patient Active Problem List   Diagnosis Date Noted   MDD (major depressive disorder), recurrent episode (HCC) 05/03/2015   Suicide attempt by multiple drug overdose (HCC) 05/03/2015    History reviewed. No pertinent surgical history.  OB History   No obstetric history on file.      Home Medications    Prior to Admission medications   Medication Sig Start Date End Date Taking? Authorizing Provider  diclofenac (VOLTAREN) 75 MG EC tablet Take 1 tablet (75 mg total) by mouth 2 (two) times daily. 11/09/19   Elson Areas, PA-C  methocarbamol (ROBAXIN) 500 MG tablet Take 1 tablet (500 mg total) by mouth 4 (four) times daily. 11/09/19   Elson Areas, PA-C  ondansetron (ZOFRAN ODT) 4 MG disintegrating tablet Take 1 tablet (4 mg total) by mouth every 8 (eight) hours as needed for nausea or vomiting. 08/13/19   Elpidio Anis, PA-C  fluticasone (FLONASE) 50 MCG/ACT nasal spray Place 1 spray into both nostrils daily. Patient not taking: Reported on 08/13/2019 06/13/19 11/09/19  Georgetta Haber, NP    Family History Family History  Problem Relation Age of Onset   Depression Mother    Hypertension Mother    Bipolar disorder Brother    Schizophrenia Brother    Drug abuse Maternal  Grandfather    Drug abuse Maternal Grandmother    Depression Maternal Grandmother    Anxiety disorder Maternal Grandmother    Drug abuse Paternal Grandfather    Drug abuse Paternal Grandmother    Depression Paternal Grandmother    Suicidality Paternal Grandmother    Diabetes Father     Social History Social History   Tobacco Use   Smoking status: Never   Smokeless tobacco: Never  Substance Use Topics   Alcohol use: No   Drug use: Yes    Frequency: 3.0 times per week    Types: Marijuana    Comment: 1 time a week     Allergies   Citrus, Other, and Shellfish allergy   Review of Systems Review of Systems  As stated above in HPI Physical Exam Triage Vital Signs ED Triage Vitals  Enc Vitals Group     BP 11/03/20 1053 139/80     Pulse Rate 11/03/20 1053 79     Resp 11/03/20 1053 18     Temp 11/03/20 1053 97.9 F (36.6 C)     Temp Source 11/03/20 1053 Oral     SpO2 11/03/20 1053 99 %     Weight --      Height --      Head Circumference --      Peak Flow --  Pain Score 11/03/20 1054 4     Pain Loc --      Pain Edu? --      Excl. in GC? --    No data found.  Updated Vital Signs BP 139/80 (BP Location: Right Arm)   Pulse 79   Temp 97.9 F (36.6 C) (Oral)   Resp 18   LMP 10/24/2020   SpO2 99%   Physical Exam Vitals and nursing note reviewed.  Constitutional:      General: She is not in acute distress.    Appearance: Normal appearance. She is not ill-appearing, toxic-appearing or diaphoretic.  Musculoskeletal:        General: Normal range of motion.  Skin:    Coloration: Skin is not jaundiced or pale.     Comments: 1.2 inch laceration of the left lateral 5th digit between PIP and DIP joint. Space is 3 mm wide  Neurological:     General: No focal deficit present.     Mental Status: She is alert.     Sensory: No sensory deficit.     UC Treatments / Results  Labs (all labs ordered are listed, but only abnormal results are displayed) Labs  Reviewed - No data to display  EKG   Radiology No results found.  Procedures Laceration Repair  Date/Time: 11/03/2020 11:34 AM Performed by: Rushie Chestnut, PA-C Authorized by: Rushie Chestnut, PA-C   Consent:    Consent obtained:  Verbal   Consent given by:  Patient   Risks discussed:  Infection, need for additional repair, pain, poor cosmetic result and poor wound healing   Alternatives discussed:  No treatment and delayed treatment Universal protocol:    Procedure explained and questions answered to patient or proxy's satisfaction: yes     Relevant documents present and verified: yes     Test results available: yes     Imaging studies available: yes     Required blood products, implants, devices, and special equipment available: yes     Site/side marked: yes     Immediately prior to procedure, a time out was called: yes     Patient identity confirmed:  Verbally with patient Anesthesia:    Anesthesia method:  Local infiltration   Local anesthetic:  Lidocaine 2% w/o epi Laceration details:    Location:  Finger   Finger location:  L small finger   Length (cm):  12   Depth (mm):  1 Pre-procedure details:    Preparation:  Patient was prepped and draped in usual sterile fashion Exploration:    Hemostasis achieved with:  Direct pressure   Imaging outcome: foreign body not noted     Wound exploration: wound explored through full range of motion     Wound extent: fascia violated     Contaminated: no   Treatment:    Area cleansed with:  Povidone-iodine and Shur-Clens   Amount of cleaning:  Standard   Irrigation solution:  Sterile saline   Irrigation method:  Syringe   Visualized foreign bodies/material removed: no     Debridement:  None   Undermining:  None   Scar revision: no     Layers repaired: subcutaneous. Skin repair:    Repair method:  Sutures   Suture size:  5-0   Suture material:  Nylon   Suture technique:  Simple interrupted   Number of sutures:   5 Approximation:    Approximation:  Loose Repair type:    Repair type:  Simple Post-procedure details:  Dressing:  Non-adherent dressing   Procedure completion:  Tolerated well, no immediate complications (including critical care time)  Medications Ordered in UC Medications - No data to display  Initial Impression / Assessment and Plan / UC Course  I have reviewed the triage vital signs and the nursing notes.  Pertinent labs & imaging results that were available during my care of the patient were reviewed by me and considered in my medical decision making (see chart for details).     New. PT tolerated sutures well. Finger splint. Discussed wound care. Tdap to be administered. ABX on hand given area and loose closure.  Final Clinical Impressions(s) / UC Diagnoses   Final diagnoses:  None   Discharge Instructions   None    ED Prescriptions   None    PDMP not reviewed this encounter.   Rushie Chestnut, [place] 11/03/20 1138

## 2020-11-03 NOTE — ED Triage Notes (Signed)
Pt presents with left pinky laceration from a meat slicer at work.

## 2020-11-12 ENCOUNTER — Ambulatory Visit (HOSPITAL_COMMUNITY)
Admission: EM | Admit: 2020-11-12 | Discharge: 2020-11-12 | Disposition: A | Discharge status: Home / Self Care | Payer: 59

## 2020-11-12 ENCOUNTER — Other Ambulatory Visit: Payer: Self-pay

## 2021-07-31 ENCOUNTER — Ambulatory Visit (HOSPITAL_COMMUNITY)
Admission: EM | Admit: 2021-07-31 | Discharge: 2021-07-31 | Disposition: A | Discharge status: Other Acute Inpatient Hospital | Payer: 59

## 2021-09-22 ENCOUNTER — Other Ambulatory Visit: Payer: Self-pay

## 2021-09-22 ENCOUNTER — Encounter (HOSPITAL_BASED_OUTPATIENT_CLINIC_OR_DEPARTMENT_OTHER): Payer: Self-pay

## 2021-09-22 ENCOUNTER — Emergency Department (HOSPITAL_BASED_OUTPATIENT_CLINIC_OR_DEPARTMENT_OTHER)
Admission: EM | Admit: 2021-09-22 | Discharge: 2021-09-22 | Disposition: A | Discharge status: Home / Self Care | Payer: 59 | Attending: Student | Admitting: Student

## 2021-09-22 DIAGNOSIS — H9201 Otalgia, right ear: Secondary | ICD-10-CM | POA: Diagnosis not present

## 2021-09-22 DIAGNOSIS — J3489 Other specified disorders of nose and nasal sinuses: Secondary | ICD-10-CM | POA: Diagnosis not present

## 2021-09-22 DIAGNOSIS — R0981 Nasal congestion: Secondary | ICD-10-CM

## 2021-09-22 MED ORDER — PSEUDOEPHEDRINE HCL ER 120 MG PO TB12: 120.0000 mg | ORAL_TABLET | Freq: Two times a day (BID) | ORAL | 0 refills | Status: AC

## 2021-09-22 MED ORDER — GUAIFENESIN ER 600 MG PO TB12: 600.0000 mg | ORAL_TABLET | Freq: Two times a day (BID) | ORAL | 0 refills | Status: AC

## 2021-09-22 MED ORDER — PSEUDOEPHEDRINE HCL 30 MG PO TABS: 30.0000 mg | ORAL_TABLET | ORAL | 0 refills | Status: DC | PRN

## 2022-05-20 ENCOUNTER — Encounter (HOSPITAL_BASED_OUTPATIENT_CLINIC_OR_DEPARTMENT_OTHER): Payer: Self-pay

## 2022-05-20 ENCOUNTER — Emergency Department (HOSPITAL_BASED_OUTPATIENT_CLINIC_OR_DEPARTMENT_OTHER): Payer: 59

## 2022-05-20 ENCOUNTER — Emergency Department (HOSPITAL_BASED_OUTPATIENT_CLINIC_OR_DEPARTMENT_OTHER)
Admission: EM | Admit: 2022-05-20 | Discharge: 2022-05-20 | Disposition: A | Discharge status: Nursing Home (Includes ICF) | Payer: 59 | Attending: Emergency Medicine | Admitting: Emergency Medicine

## 2022-05-20 ENCOUNTER — Other Ambulatory Visit (HOSPITAL_BASED_OUTPATIENT_CLINIC_OR_DEPARTMENT_OTHER): Payer: Self-pay

## 2022-05-20 ENCOUNTER — Other Ambulatory Visit: Payer: Self-pay

## 2022-05-20 DIAGNOSIS — M272 Inflammatory conditions of jaws: Secondary | ICD-10-CM

## 2022-05-20 DIAGNOSIS — R6884 Jaw pain: Secondary | ICD-10-CM

## 2022-05-20 DIAGNOSIS — M279 Disease of jaws, unspecified: Secondary | ICD-10-CM

## 2022-05-20 LAB — CBC
HCT: 39.1 % (ref 36.0–46.0)
Hemoglobin: 12.9 g/dL (ref 12.0–15.0)
MCH: 27.4 pg (ref 26.0–34.0)
MCHC: 33 g/dL (ref 30.0–36.0)
MCV: 83 fL (ref 80.0–100.0)
Platelets: 354 10*3/uL (ref 150–400)
RBC: 4.71 MIL/uL (ref 3.87–5.11)
RDW: 13 % (ref 11.5–15.5)
WBC: 18.9 10*3/uL — ABNORMAL HIGH (ref 4.0–10.5)
nRBC: 0 % (ref 0.0–0.2)

## 2022-05-20 LAB — BASIC METABOLIC PANEL
Anion gap: 7 (ref 5–15)
BUN: 7 mg/dL (ref 6–20)
CO2: 25 mmol/L (ref 22–32)
Calcium: 9.3 mg/dL (ref 8.9–10.3)
Chloride: 103 mmol/L (ref 98–111)
Creatinine, Ser: 0.79 mg/dL (ref 0.44–1.00)
GFR, Estimated: >60 mL/min (ref >60)
Glucose, Bld: 107 mg/dL — ABNORMAL HIGH (ref 70–99)
Potassium: 3.9 mmol/L (ref 3.5–5.1)
Sodium: 135 mmol/L (ref 135–145)

## 2022-05-20 LAB — HCG, SERUM, QUALITATIVE: Preg, Serum: NEGATIVE

## 2022-05-20 MED ORDER — IOHEXOL 300 MG/ML  SOLN
100.0000 mL | Freq: Once | INTRAMUSCULAR | Status: AC | PRN
  Administered 2022-05-20: 75 mL via INTRAVENOUS

## 2022-05-20 MED ORDER — MORPHINE SULFATE (PF) 4 MG/ML IV SOLN
4.0000 mg | Freq: Once | INTRAVENOUS | Status: AC
  Administered 2022-05-20: 4 mg via INTRAVENOUS
  Filled 2022-05-20: qty 1

## 2022-05-20 MED ORDER — CLINDAMYCIN PHOSPHATE 900 MG/50ML IV SOLN
900.0000 mg | Freq: Once | INTRAVENOUS | Status: AC
  Administered 2022-05-20: 900 mg via INTRAVENOUS
  Filled 2022-05-20: qty 50

## 2022-05-20 MED ORDER — ONDANSETRON HCL 4 MG/2ML IJ SOLN
4.0000 mg | Freq: Four times a day (QID) | INTRAMUSCULAR | Status: DC | PRN
  Administered 2022-05-20: 4 mg via INTRAVENOUS
  Filled 2022-05-20: qty 2

## 2022-05-20 MED ORDER — IOHEXOL 300 MG/ML  SOLN: 100.0000 mL | Freq: Once | INTRAMUSCULAR | Status: DC | PRN

## 2022-05-20 MED ORDER — LACTATED RINGERS IV BOLUS
1000.0000 mL | Freq: Once | INTRAVENOUS | Status: AC
  Administered 2022-05-20: 1000 mL via INTRAVENOUS

## 2022-05-20 MED ORDER — ACETAMINOPHEN 325 MG PO TABS
650.0000 mg | ORAL_TABLET | Freq: Once | ORAL | Status: AC
  Administered 2022-05-20: 650 mg via ORAL
  Filled 2022-05-20: qty 2

## 2022-05-20 MED ORDER — HYDROMORPHONE HCL 1 MG/ML IJ SOLN
0.5000 mg | Freq: Once | INTRAMUSCULAR | Status: AC
  Administered 2022-05-20: 0.5 mg via INTRAVENOUS
  Filled 2022-05-20: qty 1

## 2022-05-20 MED ORDER — SODIUM CHLORIDE 0.9 % IV BOLUS
1000.0000 mL | Freq: Once | INTRAVENOUS | Status: AC
  Administered 2022-05-20: 1000 mL via INTRAVENOUS

## 2022-05-20 NOTE — ED Notes (Signed)
Called [facility] Tx line; Nurse will give a call back

## 2022-05-20 NOTE — ED Notes (Signed)
[facility] here for transport via stretcher, pt alert and oriented x 4, airway intact and secretions controlled, clear speech. Using yankeur with minimal output for serosanginous fluid coming from abscess. Attempting to give update to [facility] nurse, report given during day shift, calling to give updates about pain meds and pt. Anderson Malta at [facility] pt transfer care line spoke to ED charge nurse and she declined need for updates about pt.

## 2022-05-20 NOTE — ED Notes (Signed)
[facility] tx called back; forwarded the call to [name], MD

## 2022-05-20 NOTE — ED Notes (Signed)
Called [facility] [place]; will be giving a call to [name], MD ASAP

## 2022-05-20 NOTE — ED Triage Notes (Signed)
Pt in with worsened pain and swelling to cyst on L jaw. 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 Pt does report having some trouble opening her mouth and swallowing now due to the increased swelling.

## 2022-05-20 NOTE — ED Notes (Signed)
Airway intact, controlling secretions, vitals WNL. Pt in room with mother bedside.

## 2022-05-20 NOTE — ED Provider Notes (Signed)
[facility] Emergency Department Provider Note MRN:  [id number]  Arrival date & time: 05/20/22     Chief Complaint   L Jaw Cyst   History of Present Illness   Yesenia Hatfield is a 22 y.o. year-old female with no prior past medical history presenting to the ED with chief complaint of jaw cyst.  Patient having worsening pain to the left side of the mouth and left jaw, was seen by a surgeon and was told she has some kind of bony tumor in her mandible.  Was referred to [facility].  Over the past 24 hours, the swelling and pain has gotten considerably worse.  Mom explains that she looks nothing like she did yesterday, the swelling has really been impressive over the past 24 hours.  Denies fever.  Review of Systems  A thorough review of systems was obtained and all systems are negative except as noted in the HPI and PMH.   Patient's Health History    Past Medical History:  Diagnosis Date   Allergy    Depression    couple years    No past surgical history on file.  Family History  Problem Relation Age of Onset   Depression Mother    Hypertension Mother    Bipolar disorder Brother    Schizophrenia Brother    Drug abuse Maternal Grandfather    Drug abuse Maternal Grandmother    Depression Maternal Grandmother    Anxiety disorder Maternal Grandmother    Drug abuse Paternal Grandfather    Drug abuse Paternal Grandmother    Depression Paternal Grandmother    Suicidality Paternal Grandmother    Diabetes Father     Social History   Socioeconomic History   Marital status: Single    Spouse name: Not on file   Number of children: Not on file   Years of education: Not on file   Highest education level: Not on file  Occupational History   Not on file  Tobacco Use   Smoking status: Never   Smokeless tobacco: Never  Substance and Sexual Activity   Alcohol use: No   Drug use: Yes    Frequency: 7.0 times per week    Types: Marijuana    Comment: 1 time a week    Sexual activity: Not Currently    Birth control/protection: None  Other Topics Concern   Not on file  Social History Narrative   Not on file   Social Determinants of Health   Financial Resource Strain: Not on file  Food Insecurity: Not on file  Transportation Needs: Not on file  Physical Activity: Not on file  Stress: Not on file  Social Connections: Not on file  Intimate Partner Violence: Not on file     Physical Exam   Vitals:   05/20/22 0512 05/20/22 0615  BP: (!) 166/119   Pulse: 88 81  Resp: 16   Temp: 98.2 F (36.8 C)   SpO2: 100% 100%    CONSTITUTIONAL: Well-appearing, NAD NEURO/PSYCH:  Alert and oriented x 3, no focal deficits EYES:  eyes equal and reactive ENT/NECK:  no LAD, no JVD CARDIO: Regular rate, well-perfused, normal S1 and S2 PULM:  CTAB no wheezing or rhonchi GI/GU:  non-distended, non-tender MSK/SPINE:  No gross deformities, no edema SKIN:  no rash, atraumatic   *Additional and/or pertinent findings included in MDM below  Diagnostic and Interventional Summary    EKG Interpretation  Date/Time:    Ventricular Rate:    PR Interval:  QRS Duration:   QT Interval:    QTC Calculation:   R Axis:     Text Interpretation:         Labs Reviewed  CBC - Abnormal; Notable for the following components:      Result Value   WBC 18.9 (*)    All other components within normal limits  BASIC METABOLIC PANEL - Abnormal; Notable for the following components:   Glucose, Bld 107 (*)    All other components within normal limits  HCG, SERUM, QUALITATIVE    CT Soft Tissue Neck W Contrast    (Results Pending)    Medications  iohexol (OMNIPAQUE) 300 MG/ML solution 100 mL (has no administration in time range)  sodium chloride 0.9 % bolus 1,000 mL (1,000 mLs Intravenous New Bag/Given 05/20/22 [id number])  morphine (PF) 4 MG/ML injection 4 mg (4 mg Intravenous Given 05/20/22 [id number])     Procedures  /  Critical Care Procedures  ED Course and Medical  Decision Making  Initial Impression and Ddx Concern for bony tumor or cyst, concern for osteomyelitis of the mandible, concern for odontogenic infection of some kind.  Considerable worsening since yesterday, did have a CT scan yesterday but given the significant increase in size and the appearance on exam concerning for possible infection, will obtain CT here in the emergency department, providing pain control and will reassess.  Past medical/surgical history that increases complexity of ED encounter: None  Interpretation of Diagnostics Labs, CT pending  Patient Reassessment and Ultimate Disposition/Management     Signed out to oncoming provider at shift change.  Patient management required discussion with the following services or consulting groups:  None  Complexity of Problems Addressed Acute illness or injury that poses threat of life of bodily function  Additional Data Reviewed and Analyzed Further history obtained from: Further history from spouse/family member  Additional Factors Impacting ED Encounter Risk Use of parenteral controlled substances  Barth Kirks. Sedonia Small, MD [place] [email or web address]  Final Clinical Impressions(s) / ED Diagnoses     ICD-10-CM   1. Jaw pain  R68.84       ED Discharge Orders     None        Discharge Instructions Discussed with and Provided to Patient:   Discharge Instructions   None      Maudie Flakes, MD 05/20/22 [phone]

## 2022-05-20 NOTE — ED Notes (Signed)
Called [facility] -- spoke to [place]; gave pt information; informed that I spoke to [employer]; matter of estimated time to tx patient to [facility]

## 2022-05-20 NOTE — ED Notes (Signed)
Shakir from [facility] will keep contact in regards to tx the patient to [facility].

## 2022-05-20 NOTE — ED Notes (Signed)
Called [facility]; gave pt information; forwarded the call to [name], MD

## 2022-05-20 NOTE — ED Notes (Addendum)
[facility]; gave administrator pt information; will receive call back ASAP

## 2022-05-20 NOTE — ED Provider Notes (Signed)
  Physical Exam  BP (!) 166/119 (BP Location: Right Arm)   Pulse 81   Temp 98.2 F (36.8 C) (Oral)   Resp 16   Wt 127 kg   SpO2 100%   BMI 43.85 kg/m   Physical Exam  Procedures  Procedures  ED Course / MDM    Medical Decision Making Amount and/or Complexity of Data Reviewed Labs: ordered. Radiology: ordered.  Risk Prescription drug management.   22 yo female with mass left jaw ct yesterday. Swelling and pain for several weeks.  Now erythematous and indurated. ?osteomyelitis  WBC 15,36  22 year old female presents today complaining of left jaw pain and swelling.  She states that she was told she did have a wisdom tooth extracted at the end of January.  She went to an Chief Financial Officer and was told that she probably had an infection.  She was started on amoxicillin and her symptoms improved.  She took the amoxicillin until February 15.  She states the next day she began having worsening pain and called her dentist for another referral to an oral surgeon.  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  She called and was told to wait for somebody to call her back.  Last night she had worsening pain swelling and redness.  Today here in the ED she has pain and swelling to the left mandibular region with some swelling in the intraoral area and adjacent to the left inferior posterior molars on the mucosal side. She denies any past medical history She is not currently taking any medications She has an allergy to shellfish and citrus.  She denies pregnancy. Patient has a leukocytosis to 18,900 otherwise labs are within normal limits and she has a negative pregnancy test Clindamycin is ordered I have put a page out to [facility] oral surgery Discussed with Aisha from the oral surgery office at [facility].  She states that Dr. Mellody Drown was on-call yesterday and the patient must come through his scheduler.  She states that everybody is on surgery and there is nobody available to speak  with me or except outside referrals for immediate evaluation or admission. Discussed with Dr.Reside From [facility].  He agrees patient needs admission but states that they do not have any beds and she will not be able to be admitted there Discussed with Dr. Morton Stall on 4 EEMT at [facility] and she states they have no beds or oral surgeon availability Discussed with [facility] transfer line and they state they do not have beds or capacity Discussed with [facility] and states that they do not accept transfers [facility] facilities per their protocol for this complaint Attempted to call Dr. Edward Qualia, original oral surgeon in [facility].  He advises that patient needs transfer to tertiary care center Discussed again with Dr. Reside.  Requested transport to [facility] ED.  I spoke with Dr. Delman Cheadle in the ED there and has accepted the patient to the ED. We are attempting to arrange transport   Yesenia Boss, MD 05/20/22 1358

## 2022-05-20 NOTE — ED Provider Notes (Signed)
[facility] Provider Note   CSN: [id number] Arrival date & time: 05/20/22  0500     History  Chief Complaint  Patient presents with   L Jaw Cyst    Yesenia Hatfield is a 22 y.o. female.  HPI     Home Medications Prior to Admission medications   Medication Sig Start Date End Date Taking? Authorizing Provider  diclofenac (VOLTAREN) 75 MG EC tablet Take 1 tablet (75 mg total) by mouth 2 (two) times daily. 11/09/19   Fransico Meadow, PA-C  doxycycline (VIBRAMYCIN) 100 MG capsule Take 1 capsule (100 mg total) by mouth 2 (two) times daily. 11/03/20   Hughie Closs, PA-C  methocarbamol (ROBAXIN) 500 MG tablet Take 1 tablet (500 mg total) by mouth 4 (four) times daily. 11/09/19   Fransico Meadow, PA-C  ondansetron (ZOFRAN ODT) 4 MG disintegrating tablet Take 1 tablet (4 mg total) by mouth every 8 (eight) hours as needed for nausea or vomiting. 08/13/19   Charlann Lange, PA-C  pseudoephedrine (SUDAFED 12 HOUR) 120 MG 12 hr tablet Take 1 tablet (120 mg total) by mouth 2 (two) times daily. 09/22/21   Kommor, Madison, MD  fluticasone (FLONASE) 50 MCG/ACT nasal spray Place 1 spray into both nostrils daily. Patient not taking: Reported on 08/13/2019 06/13/19 11/09/19  Zigmund Gottron, NP      Allergies    Citrus, Other, and Shellfish allergy    Review of Systems   Review of Systems  Physical Exam Updated Vital Signs BP (!) 156/105   Pulse (!) 122   Temp 100.2 F (37.9 C) (Oral)   Resp 19   Ht 1.702 m (5' 7"$ )   Wt 127 kg   SpO2 100%   BMI 43.85 kg/m  Physical Exam  ED Results / Procedures / Treatments   Labs (all labs ordered are listed, but only abnormal results are displayed) Labs Reviewed  CBC - Abnormal; Notable for the following components:      Result Value   WBC 18.9 (*)    All other components within normal limits  BASIC METABOLIC PANEL - Abnormal; Notable for the following components:   Glucose, Bld 107 (*)    All other  components within normal limits  HCG, SERUM, QUALITATIVE    EKG None  Radiology CT Soft Tissue Neck W Contrast  Result Date: 05/20/2022 CLINICAL DATA:  Soft tissue infection suspected. Cyst on left jaw. Recently diagnosed with glioblastoma. EXAM: CT NECK WITH CONTRAST TECHNIQUE: Multidetector CT imaging of the neck was performed using the standard protocol following the bolus administration of intravenous contrast. RADIATION DOSE REDUCTION: This exam was performed according to the departmental dose-optimization program which includes automated exposure control, adjustment of the mA and/or kV according to patient size and/or use of iterative reconstruction technique. CONTRAST:  27m OMNIPAQUE IOHEXOL 300 MG/ML  SOLN COMPARISON:  None Available. FINDINGS: Pharynx and larynx: The epiglottis is normal in appearance. No evidence of tonsillar hypertrophy. Salivary glands: No inflammation, mass, or stone. Thyroid: Normal. Lymph nodes: There are asymmetrically enlarged lymph nodes in the left level 1B and 2A lymph node stations, favored to be reactive. Vascular: Negative. Limited intracranial: Negative. Visualized orbits: Negative. Mastoids and visualized paranasal sinuses: No mastoid or middle ear effusion. Paranasal sinuses are clear. Skeleton: There is an expansile lucent lesion centered in the posterior aspect of the left mandible measuring approximately 3.8 x 2.0 x 2.2 cm (series 2, image 35). 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 There are regions along the margins of this lesion where there is marked cortical thinning and possible breakthrough (series 2, image 30). Along the outer table of the mandible there is a thin elongated 2.6 x 0.5 x 1.2 cm peripherally enhancing fluid collection (series 2, image 32). There is superficial and deep subcutaneous soft tissue stranding in this region (series 2, image 37). There is also asymmetric thickening of the left-sided  platysma Upper chest: Negative. Other: None. IMPRESSION: 1. Expansile lucent lesion centered in the posterior aspect of the left mandible measuring approximately 3.8 x 2.0 x 2.2 cm. 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 There are regions along the margins of this lesion where there is marked cortical thinning and possible breakthrough. 2. Thin elongated 2.6 x 0.5 x 1.2 cm peripherally enhancing fluid collection along the outer table of the mandible, with superficial and deep subcutaneous soft tissue stranding in this region, suspicious for a soft tissue abscess. 3. Asymmetrically enlarged lymph nodes in the left level 1B and 2A lymph node stations, favored to be reactive. Electronically Signed   By: Marin Roberts M.D.   On: 05/20/2022 07:48    Procedures .Critical Care  Performed by: [name], MD Authorized by: [name], MD   Critical care provider statement:    Critical care time (minutes):  65   Critical care was time spent personally by me on the following activities:  Development of treatment plan with patient or surrogate, discussions with consultants, evaluation of patient's response to treatment, examination of patient, ordering and review of laboratory studies, ordering and review of radiographic studies, ordering and performing treatments and interventions, pulse oximetry, re-evaluation of patient's condition and review of old charts Comments:     Patient with lesion to jaw with swelling- airway monitored. Multiple specialists consulted for transfer     Medications Ordered in ED Medications  sodium chloride 0.9 % bolus 1,000 mL (0 mLs Intravenous Stopped 05/20/22 0748)  morphine (PF) 4 MG/ML injection 4 mg (4 mg Intravenous Given 05/20/22 0633)  iohexol (OMNIPAQUE) 300 MG/ML solution 100 mL (75 mLs Intravenous Contrast Given 05/20/22 0726)  clindamycin (CLEOCIN) IVPB 900 mg (0 mg Intravenous Stopped 05/20/22 0924)  lactated ringers bolus  1,000 mL (0 mLs Intravenous Stopped 05/20/22 0924)  HYDROmorphone (DILAUDID) injection 0.5 mg (0.5 mg Intravenous Given 05/20/22 0851)  HYDROmorphone (DILAUDID) injection 0.5 mg (0.5 mg Intravenous Given 05/20/22 1332)  acetaminophen (TYLENOL) tablet 650 mg (650 mg Oral Given 05/20/22 1406)    ED Course/ Medical Decision Making/ A&P                             Medical Decision Making Amount and/or Complexity of Data Reviewed Labs: ordered. Radiology: ordered.  Risk OTC drugs. Prescription drug management.   Care discussed with Dr. Mayra Neer- patient awaiting [facility]        Final Clinical Impression(s) / ED Diagnoses Final diagnoses:  Jaw pain  Mixed lucency lesion of jaw  Abscess of jaw    Rx / DC Orders ED Discharge Orders     None         [name], MD 05/20/22 1523

## 2022-05-20 NOTE — ED Notes (Signed)
Called [facility] again; requesting Dr. Reside to speak with [name], MD

## 2024-02-27 ENCOUNTER — Ambulatory Visit (HOSPITAL_COMMUNITY): Admission: EM | Admit: 2024-02-27 | Discharge: 2024-02-27 | Disposition: A | Discharge status: Home / Self Care

## 2024-02-27 ENCOUNTER — Ambulatory Visit (INDEPENDENT_AMBULATORY_CARE_PROVIDER_SITE_OTHER)

## 2024-02-27 ENCOUNTER — Encounter (HOSPITAL_COMMUNITY): Payer: Self-pay

## 2024-02-27 DIAGNOSIS — S6991XA Unspecified injury of right wrist, hand and finger(s), initial encounter: Secondary | ICD-10-CM

## 2024-02-27 DIAGNOSIS — S62614A Displaced fracture of proximal phalanx of right ring finger, initial encounter for closed fracture: Secondary | ICD-10-CM | POA: Diagnosis not present

## 2024-02-27 NOTE — Discharge Instructions (Signed)
 You were diagnosed with a broken right ring finger today  1. Splint Care - Keep the splint on at all times as instructed to keep the finger immobilized and properly aligned for healing. - Do not remove or adjust the splint unless directed. - Keep the splint clean and dry. If it becomes wet or soiled, contact your provider for instructions. - Watch for signs of tightness, numbness, tingling, increased pain, or color changes in the finger. If these occur, seek medical attention promptly.  2. NSAIDs (Nonsteroidal Anti-Inflammatory Drugs) - Take NSAIDs such as ibuprofen  as directed to help reduce pain and swelling. - Recommend 600mg  of Ibuprofen  every 6 hours - Take with food to minimize stomach upset. - Do not exceed the recommended dose. - If you have a history of stomach ulcers, kidney problems, or allergies to NSAIDs, consult before use.  3. Ice Application - Apply an ice pack to the injured finger for 15-20 minutes at a time, several times a day, especially in the first 48 hours. - Always wrap the ice pack in a cloth to avoid direct contact with the skin and prevent frostbite. - Ice helps reduce swelling and pain.  4. Importance of Orthopedic Follow-Up - Follow up with orthopedics as scheduled to monitor healing and ensure the bone is properly aligned. - X-rays may be needed to confirm healing progress. - Delayed or missed follow-up can result in poor healing, finger stiffness, or permanent deformity. - Orthopedics may adjust the splint, recommend physical therapy, or discuss further treatment if needed.  General Tips - Elevate the hand above heart level as much as possible to reduce swelling. - Avoid using the injured hand for heavy lifting or activities until cleared. - Watch for signs of infection (increased redness, swelling, warmth, drainage) or worsening pain.  When to Seek Immediate Care - Severe pain not relieved by medication - Numbness, tingling, or loss of movement in the  finger - Blue or pale color of the finger - Signs of infection
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
## 2024-03-01 ENCOUNTER — Ambulatory Visit (HOSPITAL_COMMUNITY): Payer: Self-pay
